# Patient Record
Sex: Male | Born: 1990 | Race: Black or African American | Hispanic: No | Marital: Single | Smoking: Current every day smoker
Health system: Southern US, Community
[De-identification: ages and names within clinical notes are randomized; demographics above are authoritative.]

## PROBLEM LIST (undated history)

## (undated) DIAGNOSIS — M778 Other enthesopathies, not elsewhere classified: Secondary | ICD-10-CM

## (undated) DIAGNOSIS — G43909 Migraine, unspecified, not intractable, without status migrainosus: Secondary | ICD-10-CM

---

## 2011-05-11 ENCOUNTER — Emergency Department (HOSPITAL_COMMUNITY)
Admission: EM | Admit: 2011-05-11 | Discharge: 2011-05-11 | Disposition: A | Discharge status: Home / Self Care | Payer: Self-pay | Attending: Emergency Medicine | Admitting: Emergency Medicine

## 2011-05-11 DIAGNOSIS — R0989 Other specified symptoms and signs involving the circulatory and respiratory systems: Secondary | ICD-10-CM | POA: Insufficient documentation

## 2011-05-11 DIAGNOSIS — R0609 Other forms of dyspnea: Secondary | ICD-10-CM | POA: Insufficient documentation

## 2011-05-11 DIAGNOSIS — J029 Acute pharyngitis, unspecified: Secondary | ICD-10-CM | POA: Insufficient documentation

## 2011-06-22 ENCOUNTER — Emergency Department (HOSPITAL_COMMUNITY)
Admission: EM | Admit: 2011-06-22 | Discharge: 2011-06-22 | Disposition: A | Discharge status: Home / Self Care | Payer: Self-pay | Attending: Emergency Medicine | Admitting: Emergency Medicine

## 2011-06-22 DIAGNOSIS — J069 Acute upper respiratory infection, unspecified: Secondary | ICD-10-CM | POA: Insufficient documentation

## 2011-06-22 DIAGNOSIS — R070 Pain in throat: Secondary | ICD-10-CM | POA: Insufficient documentation

## 2011-06-22 DIAGNOSIS — R05 Cough: Secondary | ICD-10-CM | POA: Insufficient documentation

## 2011-06-22 DIAGNOSIS — R509 Fever, unspecified: Secondary | ICD-10-CM | POA: Insufficient documentation

## 2011-06-22 DIAGNOSIS — R059 Cough, unspecified: Secondary | ICD-10-CM | POA: Insufficient documentation

## 2011-06-22 DIAGNOSIS — J3489 Other specified disorders of nose and nasal sinuses: Secondary | ICD-10-CM | POA: Insufficient documentation

## 2013-01-08 ENCOUNTER — Emergency Department (HOSPITAL_COMMUNITY)
Admission: EM | Admit: 2013-01-08 | Discharge: 2013-01-08 | Disposition: A | Discharge status: Home / Self Care | Payer: BC Managed Care – PPO | Attending: Emergency Medicine | Admitting: Emergency Medicine

## 2013-01-08 ENCOUNTER — Encounter (HOSPITAL_COMMUNITY): Payer: Self-pay | Admitting: Emergency Medicine

## 2013-01-08 DIAGNOSIS — B9789 Other viral agents as the cause of diseases classified elsewhere: Secondary | ICD-10-CM | POA: Insufficient documentation

## 2013-01-08 DIAGNOSIS — J020 Streptococcal pharyngitis: Secondary | ICD-10-CM

## 2013-01-08 DIAGNOSIS — B349 Viral infection, unspecified: Secondary | ICD-10-CM

## 2013-01-08 DIAGNOSIS — R509 Fever, unspecified: Secondary | ICD-10-CM | POA: Insufficient documentation

## 2013-01-08 DIAGNOSIS — Z87891 Personal history of nicotine dependence: Secondary | ICD-10-CM | POA: Insufficient documentation

## 2013-01-08 LAB — RAPID STREP SCREEN (MED CTR MEBANE ONLY): Streptococcus, Group A Screen (Direct): NEGATIVE

## 2013-01-08 MED ORDER — AMOXICILLIN 500 MG PO CAPS: 500.0000 mg | ORAL_CAPSULE | Freq: Two times a day (BID) | ORAL | Status: DC

## 2013-01-08 MED ORDER — ACETAMINOPHEN 500 MG PO TABS
1000.0000 mg | ORAL_TABLET | Freq: Once | ORAL | Status: AC
Start: 2013-01-08 — End: 2013-01-08
  Administered 2013-01-08: 1000 mg via ORAL
  Filled 2013-01-08: qty 2

## 2013-01-08 MED ORDER — KETOROLAC TROMETHAMINE 60 MG/2ML IM SOLN
60.0000 mg | Freq: Once | INTRAMUSCULAR | Status: AC
  Administered 2013-01-08: 60 mg via INTRAMUSCULAR
  Filled 2013-01-08: qty 2

## 2013-01-08 NOTE — ED Notes (Addendum)
Pt here for c/o generalized aches and  Pain, chills and Sore throat x 4 days

## 2013-01-08 NOTE — ED Provider Notes (Signed)
History     CSN: [id number]  Arrival date & time 01/08/13  0900   First MD Initiated Contact with Patient 01/08/13 [phone]      No chief complaint on file.   (Consider location/radiation/quality/duration/timing/severity/associated sxs/prior treatment) The history is provided by the patient.  Allen Mckinney is a 22 y.o. male otherwise a here presenting with generalized body aches for the last 5 days. Has sore throat and bodyaches for the last 5 days. Also subjective fever and chills at home. He has some nonproductive cough as well. He had a flu shot in December. No urinary symptoms or normal pain or chest pain.    History reviewed. No pertinent past medical history.  No past surgical history on file.  No family history on file.  History  Substance Use Topics  . Smoking status: Former Smoker -- 0.50 packs/day    Types: Cigarettes  . Smokeless tobacco: Not on file  . Alcohol Use: No      Review of Systems  Constitutional: Positive for fever and chills.  HENT: Positive for sore throat.   All other systems reviewed and are negative.    Allergies  Review of patient's allergies indicates no known allergies.  Home Medications   Current Outpatient Rx  Name  Route  Sig  Dispense  Refill  . Dextromethorphan-Guaifenesin (ALKA-SELTZER PLUS MUCUS & CONG) 10-200 MG CAPS   Oral   Take 1 tablet by mouth every 6 (six) hours as needed (cold symptoms).         Marland Kitchen ibuprofen (ADVIL,MOTRIN) 800 MG tablet   Oral   Take 800 mg by mouth every 8 (eight) hours as needed for pain (pain).         . pseudoephedrine-guaifenesin (MUCINEX D) 60-600 MG per tablet   Oral   Take 1 tablet by mouth every 12 (twelve) hours.           BP 116/72  Pulse 104  Temp(Src) 99.6 F (37.6 C) (Oral)  Resp 20  SpO2 100%  Physical Exam  Nursing note and vitals reviewed. Constitutional: He is oriented to person, place, and time.  Uncomfortable, shivering from chills.   HENT:  Head: Normocephalic.   OP red with white exudates. Tonsils not enlarged.   Eyes: Conjunctivae are normal. Pupils are equal, round, and reactive to light.  Neck: Normal range of motion.  + mild cervical tender LAD   Cardiovascular: Normal rate, regular rhythm and normal heart sounds.   Pulmonary/Chest: Effort normal and breath sounds normal. No respiratory distress. He has no wheezes. He has no rales.  Abdominal: Soft. Bowel sounds are normal. He exhibits no distension. There is no tenderness. There is no rebound and no guarding.  Musculoskeletal: Normal range of motion.  Neurological: He is alert and oriented to person, place, and time.  Skin: Skin is warm and dry.  Psychiatric: He has a normal mood and affect. His behavior is normal. Judgment and thought content normal.    ED Course  Procedures (including critical care time)  Labs Reviewed  RAPID STREP SCREEN   No results found.   No diagnosis found.    MDM  Allen Mckinney is a 22 y.o. male here with myalgias and sore throat. Centor criteria 3/4. Rapid strep sent. He probably has viral syndrome as well. I counseled him about tamiflu and said that he is outside window for treatment. He understands the risk and benefits of the medicine and decided not to take it.   10:17 AM Strep neg.  Felt better after meds. My clinical suspicion high for strep so I gave him a script for amoxicillin and told him to take it if he still doesn't feel better in 2-3 days.        Richardean Canal, MD 01/08/13 1018

## 2015-08-19 ENCOUNTER — Emergency Department (HOSPITAL_COMMUNITY)
Admission: EM | Admit: 2015-08-19 | Discharge: 2015-08-19 | Disposition: A | Discharge status: Home / Self Care | Payer: BLUE CROSS/BLUE SHIELD | Attending: Emergency Medicine | Admitting: Emergency Medicine

## 2015-08-19 ENCOUNTER — Encounter (HOSPITAL_COMMUNITY): Payer: Self-pay | Admitting: Emergency Medicine

## 2015-08-19 DIAGNOSIS — K644 Residual hemorrhoidal skin tags: Secondary | ICD-10-CM | POA: Insufficient documentation

## 2015-08-19 DIAGNOSIS — K602 Anal fissure, unspecified: Secondary | ICD-10-CM | POA: Insufficient documentation

## 2015-08-19 DIAGNOSIS — K649 Unspecified hemorrhoids: Secondary | ICD-10-CM

## 2015-08-19 DIAGNOSIS — K6289 Other specified diseases of anus and rectum: Secondary | ICD-10-CM | POA: Diagnosis present

## 2015-08-19 DIAGNOSIS — Z720 Tobacco use: Secondary | ICD-10-CM | POA: Diagnosis not present

## 2015-08-19 LAB — POC OCCULT BLOOD, ED: Fecal Occult Bld: NEGATIVE

## 2015-08-19 MED ORDER — DILTIAZEM GEL 2 %: 1.0000 "application " | Freq: Two times a day (BID) | CUTANEOUS | Status: DC | PRN

## 2015-08-19 MED ORDER — HYDROCORTISONE 2.5 % RE CREA: TOPICAL_CREAM | RECTAL | Status: DC

## 2015-08-19 MED ORDER — NITROGLYCERIN 0.4 % RE OINT: 1.0000 "application " | TOPICAL_OINTMENT | Freq: Two times a day (BID) | RECTAL | Status: DC | PRN

## 2015-08-19 NOTE — ED Provider Notes (Signed)
CSN: [id number]     Arrival date & time 08/19/15  1820 History   First MD Initiated Contact with Patient 08/19/15 1911     Chief Complaint  Patient presents with  . Rectal Pain     (Consider location/radiation/quality/duration/timing/severity/associated sxs/prior Treatment) Patient is a 24 y.o. male presenting with general illness. The history is provided by the patient.  Illness Location:  Anal region Quality:  Spasms Severity:  Moderate Onset quality:  Gradual Duration:  3 weeks Timing:  Intermittent Progression:  Unchanged Chronicity:  New Context:  Following hard stool, small episode of BRBPR Relieved by:  Nothing tried Worsened by:  Defecation Ineffective treatments:  Nothing tried Associated symptoms: no abdominal pain, no chest pain, no ear pain, no fatigue, no fever, no headaches, no loss of consciousness, no nausea, no rash and no shortness of breath   Risk factors:  Constipation hx   History reviewed. No pertinent past medical history. History reviewed. No pertinent past surgical history. No family history on file. Social History  Substance Use Topics  . Smoking status: Current Every Day Smoker -- 0.50 packs/day    Types: Cigarettes  . Smokeless tobacco: None  . Alcohol Use: No    Review of Systems  Constitutional: Negative for fever and fatigue.  HENT: Negative for ear pain and facial swelling.   Respiratory: Negative for shortness of breath.   Cardiovascular: Negative for chest pain.  Gastrointestinal: Positive for constipation and rectal pain. Negative for nausea and abdominal pain.  Genitourinary: Negative for dysuria.  Musculoskeletal: Negative for back pain.  Skin: Negative for rash.  Neurological: Negative for loss of consciousness and headaches.  Psychiatric/Behavioral: Negative for confusion.      Allergies  Review of patient's allergies indicates no known allergies.  Home Medications   Prior to Admission medications   Medication Sig  Start Date End Date Taking? Authorizing Provider  amoxicillin (AMOXIL) 500 MG capsule Take 1 capsule (500 mg total) by mouth 2 (two) times daily. 01/08/13   Richardean Canal, MD  Dextromethorphan-Guaifenesin (ALKA-SELTZER PLUS MUCUS & CONG) 10-200 MG CAPS Take 1 tablet by mouth every 6 (six) hours as needed (cold symptoms).    Historical Provider, MD  ibuprofen (ADVIL,MOTRIN) 800 MG tablet Take 800 mg by mouth every 8 (eight) hours as needed for pain (pain).    Historical Provider, MD  pseudoephedrine-guaifenesin (MUCINEX D) 60-600 MG per tablet Take 1 tablet by mouth every 12 (twelve) hours.    Historical Provider, MD   BP 122/71 mmHg  Pulse 77  Temp(Src) 98.9 F (37.2 C) (Oral)  Resp 16  SpO2 99% Physical Exam  Constitutional: He is oriented to person, place, and time. He appears well-developed and well-nourished. No distress.  HENT:  Head: Normocephalic and atraumatic.  Right Ear: External ear normal.  Left Ear: External ear normal.  Nose: Nose normal.  Mouth/Throat: Oropharynx is clear and moist. No oropharyngeal exudate.  Eyes: Conjunctivae and EOM are normal. Pupils are equal, round, and reactive to light. Right eye exhibits no discharge. Left eye exhibits no discharge. No scleral icterus.  Neck: Normal range of motion. Neck supple. No JVD present. No tracheal deviation present. No thyromegaly present.  Cardiovascular: Normal rate, regular rhythm and intact distal pulses.   Pulmonary/Chest: Effort normal. No stridor. No respiratory distress.  Abdominal: Soft. He exhibits no distension.  Genitourinary: Rectal exam shows external hemorrhoid, fissure and tenderness. Rectal exam shows anal tone normal. Prostate is not enlarged and not tender.  Musculoskeletal: Normal range of motion.  He exhibits no edema or tenderness.  Lymphadenopathy:    He has no cervical adenopathy.  Neurological: He is alert and oriented to person, place, and time.  Skin: Skin is warm and dry. No rash noted. He is not  diaphoretic. No erythema. No pallor.  Psychiatric: He has a normal mood and affect. His behavior is normal. Judgment and thought content normal.  Nursing note and vitals reviewed.   ED Course  Procedures (including critical care time) Labs Review Labs Reviewed  POC OCCULT BLOOD, ED     MDM   Final diagnoses:  None    Patient endorsing intermittent anal spasms following a bout of hard stools approximately 3 weeks ago. Patient had brief amount of bright red blood per rectum. But is not having any rectal bleeding time. Patient is not tried anything. Hard stools have resolved. On exam patient has findings consistent with healing anal fissure with small tag at the posterior anus. Also some external hemorrhoids.  No concerning findings. Patient patient advised on use of fiber and other stool softeners to prevent recurrence. We'll provide prescription for antispasmodic topical agents and steroid cream for his hemorrhoids.  Patient  given return precautions for anal pain.  Advised to return for worsening symptoms including chest pain, shortness of breath, severe headache, intractable nausea or vomiting, fever, or chills, inability to take medications, or other acute concerns.  Advised to follow up with PCP in 1 week.  Patient  in agreement with and expressed understanding of follow plan, plan of care, and return precautions.  All questions answered prior to discharge.  Patient was discharged in stable condition, ambulating without difficulty.   Patient care was discussed with my attending, Dr. Radford PaxBeaton.   Gavin PoundJustin Sephiroth Mcluckie, MD 08/20/15 1542  Nelva Nayobert Beaton, MD 08/25/15 [phone]

## 2015-08-19 NOTE — Discharge Instructions (Signed)
Anal Fissure, Adult An anal fissure is a small tear or crack in the skin around the anus. Bleeding from a fissure usually stops on its own within a few minutes. However, bleeding will often occur again with each bowel movement until the crack heals. CAUSES This condition may be caused by:  Passing large, hard stool (feces).  Frequent diarrhea.  Constipation.  Inflammatory bowel disease (Crohn disease or ulcerative colitis).  Infections.  Anal sex. SYMPTOMS Symptoms of this condition include:  Bleeding from the rectum.  Small amounts of blood seen on your stool, on toilet paper, or in the toilet after a bowel movement.  Painful bowel movements.  Itching or irritation around the anus. DIAGNOSIS A health care provider may diagnose this condition by closely examining the anal area. An anal fissure can usually be seen with careful inspection. In some cases, a rectal exam may be performed, or a short tube (anoscope) may be used to examine the anal canal. TREATMENT Treatment for this condition may include:  Taking steps to avoid constipation. This may include making changes to your diet, such as increasing your intake of fiber or fluid.  Taking fiber supplements. These supplements can soften your stool to help make bowel movements easier. Your health care provider may also prescribe a stool softener if your stool is often hard.  Taking sitz baths. This may help to heal the tear.  Using medicated creams or ointments. These may be prescribed to lessen discomfort. HOME CARE INSTRUCTIONS Eating and Drinking  Avoid foods that may be constipating, such as bananas and dairy products.  Drink enough fluid to keep your urine clear or pale yellow.  Maintain a diet that is high in fruits, whole grains, and vegetables. General Instructions  Keep the anal area as clean and dry as possible.  Take sitz baths as told by your health care provider. Do not use soap in the sitz baths.  Take  over-the-counter and prescription medicines only as told by your health care provider.  Use creams or ointments only as told by your health care provider.  Keep all follow-up visits as told by your health care provider. This is important. SEEK MEDICAL CARE IF:  You have more bleeding.  You have a fever.  You have diarrhea that is mixed with blood.  You continue to have pain.  Your problem is getting worse rather than better.   This information is not intended to replace advice given to you by your health care provider. Make sure you discuss any questions you have with your health care provider.   Document Released: 10/14/2005 Document Revised: 07/05/2015 Document Reviewed: 01/09/2015 Elsevier Interactive Patient Education 2016 [employer].  Hemorrhoids Hemorrhoids are swollen veins around the rectum or anus. There are two types of hemorrhoids:   Internal hemorrhoids. These occur in the veins just inside the rectum. They may poke through to the outside and become irritated and painful.  External hemorrhoids. These occur in the veins outside the anus and can be felt as a painful swelling or hard lump near the anus. CAUSES  Pregnancy.   Obesity.   Constipation or diarrhea.   Straining to have a bowel movement.   Sitting for long periods on the toilet.  Heavy lifting or other activity that caused you to strain.  Anal intercourse. SYMPTOMS   Pain.   Anal itching or irritation.   Rectal bleeding.   Fecal leakage.   Anal swelling.   One or more lumps around the anus.  DIAGNOSIS  Your caregiver may be able to diagnose hemorrhoids by visual examination. Other examinations or tests that may be performed include:   Examination of the rectal area with a gloved hand (digital rectal exam).   Examination of anal canal using a small tube (scope).   A blood test if you have lost a significant amount of blood.  A test to look inside the colon  (sigmoidoscopy or colonoscopy). TREATMENT Most hemorrhoids can be treated at home. However, if symptoms do not seem to be getting better or if you have a lot of rectal bleeding, your caregiver may perform a procedure to help make the hemorrhoids get smaller or remove them completely. Possible treatments include:   Placing a rubber band at the base of the hemorrhoid to cut off the circulation (rubber band ligation).   Injecting a chemical to shrink the hemorrhoid (sclerotherapy).   Using a tool to burn the hemorrhoid (infrared light therapy).   Surgically removing the hemorrhoid (hemorrhoidectomy).   Stapling the hemorrhoid to block blood flow to the tissue (hemorrhoid stapling).  HOME CARE INSTRUCTIONS   Eat foods with fiber, such as whole grains, beans, nuts, fruits, and vegetables. Ask your doctor about taking products with added fiber in them (fibersupplements).  Increase fluid intake. Drink enough water and fluids to keep your urine clear or pale yellow.   Exercise regularly.   Go to the bathroom when you have the urge to have a bowel movement. Do not wait.   Avoid straining to have bowel movements.   Keep the anal area dry and clean. Use wet toilet paper or moist towelettes after a bowel movement.   Medicated creams and suppositories may be used or applied as directed.   Only take over-the-counter or prescription medicines as directed by your caregiver.   Take warm sitz baths for 15-20 minutes, 3-4 times a day to ease pain and discomfort.   Place ice packs on the hemorrhoids if they are tender and swollen. Using ice packs between sitz baths may be helpful.   Put ice in a plastic bag.   Place a towel between your skin and the bag.   Leave the ice on for 15-20 minutes, 3-4 times a day.   Do not use a donut-shaped pillow or sit on the toilet for long periods. This increases blood pooling and pain.  SEEK MEDICAL CARE IF:  You have increasing pain and  swelling that is not controlled by treatment or medicine.  You have uncontrolled bleeding.  You have difficulty or you are unable to have a bowel movement.  You have pain or inflammation outside the area of the hemorrhoids. MAKE SURE YOU:  Understand these instructions.  Will watch your condition.  Will get help right away if you are not doing well or get worse.   This information is not intended to replace advice given to you by your health care provider. Make sure you discuss any questions you have with your health care provider.   Document Released: 10/11/2000 Document Revised: 09/30/2012 Document Reviewed: 08/18/2012 Elsevier Interactive Patient Education 2016 Elsevier Inc.  High-Fiber Diet Fiber, also called dietary fiber, is a type of carbohydrate found in fruits, vegetables, whole grains, and beans. A high-fiber diet can have many health benefits. Your health care provider may recommend a high-fiber diet to help:  Prevent constipation. Fiber can make your bowel movements more regular.  Lower your cholesterol.  Relieve hemorrhoids, uncomplicated diverticulosis, or irritable bowel syndrome.  Prevent overeating as part of a weight-loss  plan.  Prevent heart disease, type 2 diabetes, and certain cancers. WHAT IS MY PLAN? The recommended daily intake of fiber includes:  38 grams for men under age 24.  30 grams for men over age 24.  25 grams for women under age 24.  21 grams for women over age 24. You can get the recommended daily intake of dietary fiber by eating a variety of fruits, vegetables, grains, and beans. Your health care provider may also recommend a fiber supplement if it is not possible to get enough fiber through your diet. WHAT DO I NEED TO KNOW ABOUT A HIGH-FIBER DIET?  Fiber supplements have not been widely studied for their effectiveness, so it is better to get fiber through food sources.  Always check the fiber content on thenutrition facts label of  any prepackaged food. Look for foods that contain at least 5 grams of fiber per serving.  Ask your dietitian if you have questions about specific foods that are related to your condition, especially if those foods are not listed in the following section.  Increase your daily fiber consumption gradually. Increasing your intake of dietary fiber too quickly may cause bloating, cramping, or gas.  Drink plenty of water. Water helps you to digest fiber. WHAT FOODS CAN I EAT? Grains Whole-grain breads. Multigrain cereal. Oats and oatmeal. Brown rice. Barley. Bulgur wheat. Millet. Bran muffins. Popcorn. Rye wafer crackers. Vegetables Sweet potatoes. Spinach. Kale. Artichokes. Cabbage. Broccoli. Green peas. Carrots. Squash. Fruits Berries. Pears. Apples. Oranges. Avocados. Prunes and raisins. Dried figs. Meats and Other Protein Sources Navy, kidney, pinto, and soy beans. Split peas. Lentils. Nuts and seeds. Dairy Fiber-fortified yogurt. Beverages Fiber-fortified soy milk. Fiber-fortified orange juice. Other Fiber bars. The items listed above may not be a complete list of recommended foods or beverages. Contact your dietitian for more options. WHAT FOODS ARE NOT RECOMMENDED? Grains White bread. Pasta made with refined flour. White rice. Vegetables Fried potatoes. Canned vegetables. Well-cooked vegetables.  Fruits Fruit juice. Cooked, strained fruit. Meats and Other Protein Sources Fatty cuts of meat. Fried Environmental education officerpoultry or fried fish. Dairy Milk. Yogurt. Cream cheese. Sour cream. Beverages Soft drinks. Other Cakes and pastries. Butter and oils. The items listed above may not be a complete list of foods and beverages to avoid. Contact your dietitian for more information. WHAT ARE SOME TIPS FOR INCLUDING HIGH-FIBER FOODS IN MY DIET?  Eat a wide variety of high-fiber foods.  Make sure that half of all grains consumed each day are whole grains.  Replace breads and cereals made from refined  flour or white flour with whole-grain breads and cereals.  Replace white rice with brown rice, bulgur wheat, or millet.  Start the day with a breakfast that is high in fiber, such as a cereal that contains at least 5 grams of fiber per serving.  Use beans in place of meat in soups, salads, or pasta.  Eat high-fiber snacks, such as berries, raw vegetables, nuts, or popcorn.   This information is not intended to replace advice given to you by your health care provider. Make sure you discuss any questions you have with your health care provider.   Document Released: 10/14/2005 Document Revised: 11/04/2014 Document Reviewed: 03/29/2014 Elsevier Interactive Patient Education 2016 [employer]  Emergency Department Resource Guide 1) Find a Doctor and Pay Out of Pocket Although you won't have to find out who is covered by your insurance plan, it is a good idea to ask around and get recommendations. You will then need  to call the office and see if the doctor you have chosen will accept you as a new patient and what types of options they offer for patients who are self-pay. Some doctors offer discounts or will set up payment plans for their patients who do not have insurance, but you will need to ask so you aren't surprised when you get to your appointment.  2) Contact Your Local Health Department Not all health departments have doctors that can see patients for sick visits, but many do, so it is worth a call to see if yours does. If you don't know where your local health department is, you can check in your phone book. The CDC also has a tool to help you locate your state's health department, and many state websites also have listings of all of their local health departments.  3) Find a Walk-in Clinic If your illness is not likely to be very severe or complicated, you may want to try a walk in clinic. These are popping up all over the country in pharmacies, drugstores, and shopping centers. They're  usually staffed by nurse practitioners or physician assistants that have been trained to treat common illnesses and complaints. They're usually fairly quick and inexpensive. However, if you have serious medical issues or chronic medical problems, these are probably not your best option.  No Primary Care Doctor: - Call Health Connect at  [phone] - they can help you locate a primary care doctor that  accepts your insurance, provides certain services, etc. - Physician Referral Service- [phone]  Chronic Pain Problems: Organization         Address  Phone   Notes  [facility]  [phone] Patients need to be referred by their primary care doctor.   Medication Assistance: Organization         Address  Phone   Notes  [facility] Medication [facility] [address] [place], [place] [address] [phone] --Must be a resident of [facility] -- Must have NO insurance coverage whatsoever (no Medicaid/ Medicare, etc.) -- The pt. MUST have a primary care doctor that directs their care regularly and follows them in the community   [facility]ist  [phone]   [employer]  [phone]    Agencies that provide inexpensive medical care: Organization         Address  Phone   Notes  [facility]  [phone]   [facility]    [phone]   [facility] [address] [place], [place] [address] [phone]   [facility] [address] [place] [place] [phone]   Planned Parenthood    [phone]   [facility]    [phone]   Community [facility] and [facility]  [address], [place] Phone:  [phone], Fax:  [phone] Hours of Operation:  9 am - 6 pm, M-F.  Also accepts Medicaid/Medicare and self-pay.  [facility]  [address], [place] Phone: [phone], Fax: [phone]. Hours  of Operation:  8:30 am - 5:30 pm, M-F.  Also accepts Medicaid and self-pay.  [facility] [place] [address] [place] Phone: [phone]   [facility] [address] Natasha Bence [place], [place] [phone], Ext. [phone] Mondays & Thursdays: 7-9 AM.  First 15 patients are seen on a first come, first serve basis.  Medicaid-accepting [facility] Providers:  Organization         Address  Phone   Notes  [facility] [address] [place], [place] [phone] Also accepts self-pay patients.  [facility] [address] Laurell Josephs [place], [place]  [phone]   [facility] [address], [place] [phone]   [facility] [address], [place] [phone]   Renaye Rakers [address], [place]   [phone] Only accepts [place] patients after they have their name applied to their card.   Self-Pay (no insurance) in [facility]:  Organization         Address  Phone   Notes  Sickle Cell Patients, [facility] [address] [place], [place] [phone]   [facility] [address] [place], [place] [phone]   [facility] [place]  [address], [place] [phone]   [facility]/Dr. Osei-Bonsu  [address] [place] or [address], [place] [phone] Phone number for both [place] and [place] locations is the same.  Urgent Medical and [facility] [address] [place] [phone]   [facility] [address], [place] or [address] [phone]   [facility] [address], [place] [phone], phone; [phone], fax Sees patients 1st and 3rd Saturday of every month.  Must not qualify for public or private insurance (i.e. Medicaid,  Medicare, [place] Health Choice, Veterans' Benefits)  Household income should be no more than 200% of the poverty level The clinic cannot treat you if you are pregnant or think you are pregnant  Sexually transmitted diseases are not treated at the clinic.    Dental Care: Organization         Address  Phone  Notes  [facility] Department of [facility] [address] [place], [place] [phone] Accepts children up to age 61 who are enrolled in [place] or [place] Health Choice; pregnant women with a Medicaid card; and children who have applied for Medicaid or [place] Health Choice, but were declined, whose parents can pay a reduced fee at time of service.  [facility] Department of [facility]  [address], [place] [phone] Accepts children up to age 27 who are enrolled in [place] or [place] Health Choice; pregnant women with a Medicaid card; and children who have applied for Medicaid or [place] Health Choice, but were declined, whose parents can pay a reduced fee at time of service.  [facility]  [address] [place], [place] [phone] Patients are seen by appointment only. Walk-ins are not accepted. [facility] will see patients 83 years of age and older. Monday - Tuesday (8am-5pm) Most Wednesdays (8:30-5pm) $30 per visit, cash only  [facility] Adult Dental Access PRO[facility]  [address], [facility] [phone] Patients are seen by appointment only. Walk-ins are not accepted. [facility] will see patients 17 years of age and older. One Wednesday Evening (Monthly: Volunteer Based).  $30 per visit, cash only  [employer]  [phone] for adults; Children under age 23, call Graduate Pediatric Dentistry at [phone]. Children aged 46-14, please call [phone] to request a pediatric  application.  Dental services are provided in all areas of dental care including fillings, crowns  and bridges, complete and partial dentures, implants, gum treatment, root canals, and extractions. Preventive care is also provided. Treatment is provided to both adults and children. Patients are selected via a lottery and there is often a waiting list.   [facility] [address] [place]  [phone] [email or web address]   [facility] [address] [place], [place] [phone], Ext. [phone] Second and Fourth Thursday of each month, opens at 6:30 AM; Clinic ends at 9 AM.  Patients are seen on a first-come first-served basis, and a limited number are seen during each clinic.   [facility]  [address] Ether Griffins [place], [place] [phone]   Eligibility Requirements You must have lived in [place], [place], or [place] counties for at least the last three months.   You cannot be eligible for state or federal sponsored [place], including [employer], [place], or [employer].   You generally cannot be eligible for healthcare insurance through your employer.    How to apply: Eligibility screenings are held every Tuesday and Wednesday afternoon from 1:00 pm until 4:00 pm. You do not need an appointment for the interview!  [facility] [address] [place], [place] [phone]   [facility] Health Department  [phone]   [facility] Health Department  [phone]   [facility] Health Department  [phone]    Behavioral Health Resources in the Community: Intensive Outpatient Programs Organization         Address  Phone  Notes  [facility] Services [address] [place], [place] [phone]   [facility] Outpatient [address] [place], [place] [phone]   [facility] [address] [place], [place]  [phone]   [facility] Mental Health [address],  [place], [place] [phone] or [phone]   Substance Abuse  Resources Organization         Address  Phone  Notes  [facility]  [phone]   [facility]  [phone]   [facility] [place]  [phone]   Floydene Flock  [phone]   Residential & Outpatient Substance Abuse Program  [phone]   Psychological Services Organization         Address  Phone  Notes  [facility] Health  [phone]   [facility] Services  [phone]   [facility] Mental Health [address] [place] [phone] or [phone]    Mobile Crisis Teams Organization         Address  Phone  Notes  [facility], Mobile Crisis Care Unit  [phone]   [facility]  [address] [place], [place] [phone]   Doristine Locks [address] [place] [phone]    Self-Help/Support Groups Organization         Address  Phone             Notes  [facility] [place] - variety of support groups  336- I7437963 Call for more information  Narcotics Anonymous (NA), [facility] [address], [employer] [place]  2 meetings at this location   Statistician         Address  Phone  Notes  [facility] [address],    [place]  [phone]   [facility]  [address], [place] [address],  [place], [place] [phone]   [facility] [address] [place], [place] [phone] Admissions: 8am-3pm M-F  [facility] [address],    [place], [place] [phone]   [facility] [address] [place], [place], [place] [phone]   [facility] [address],  [place], [place] [phone]   [facility] - Intensive Outpatient [address] Laurell Josephs [address], [place], [place] [phone]   [facility] ([facility] [address] [place]  [place], [place] [phone] or [phone]   [facility] ([facility]) [address], [place], [place] [phone] Accepts Medicaid  [facility] [address]  [employer] [place] [phone] Substance Abuse/Addiction Treatment   [facility] Organization         Address  Phone  Notes  [facility]  [phone]   Angie Fava, PhD [address] Ervin Knack [place], [place]   [phone] or [phone]   [facility] Behavioral   [address] [place], [place] [phone]   [facility] [address] [place], [place] [phone] Insurance/Medicaid/sponsorship through [facility] and Families [address]                                    [place], [place] [phone] Therapy/tele-psych/case  [facility] [address], [place] [phone]    Dr. Lolly Mustache  [phone]   [facility] [place] [facility]  [employer] [facility] Dept. 1) [address] [place] 2) [address], [place] 3)  [address] [place] [address], [place] [phone]  [phone]   [facility] Child Abuse Hotline (249)581-4905 or [phone] (After Hours)

## 2015-08-19 NOTE — ED Notes (Signed)
Pt reports rectal pain x 3 weeks. Pt states pain occurs upon bowel movements and change in position. Pt reports blood in stool three weeks ago when pain started but denies blood in stool at this time. Pt denies N/V/D, LOC, and syncope. Pt alert and oriented at this time in triage, ambulatory, and NAD.

## 2016-02-01 ENCOUNTER — Emergency Department (HOSPITAL_COMMUNITY)
Admission: EM | Admit: 2016-02-01 | Discharge: 2016-02-01 | Disposition: A | Discharge status: Home / Self Care | Payer: BLUE CROSS/BLUE SHIELD | Attending: Emergency Medicine | Admitting: Emergency Medicine

## 2016-02-01 ENCOUNTER — Encounter (HOSPITAL_COMMUNITY): Payer: Self-pay | Admitting: Emergency Medicine

## 2016-02-01 DIAGNOSIS — Z791 Long term (current) use of non-steroidal anti-inflammatories (NSAID): Secondary | ICD-10-CM | POA: Diagnosis not present

## 2016-02-01 DIAGNOSIS — G43809 Other migraine, not intractable, without status migrainosus: Secondary | ICD-10-CM

## 2016-02-01 DIAGNOSIS — R51 Headache: Secondary | ICD-10-CM | POA: Diagnosis present

## 2016-02-01 DIAGNOSIS — F17210 Nicotine dependence, cigarettes, uncomplicated: Secondary | ICD-10-CM | POA: Diagnosis not present

## 2016-02-01 MED ORDER — SODIUM CHLORIDE 0.9 % IV SOLN
1000.0000 mL | INTRAVENOUS | Status: DC
  Administered 2016-02-01: 1000 mL via INTRAVENOUS

## 2016-02-01 MED ORDER — MORPHINE SULFATE (PF) 4 MG/ML IV SOLN
4.0000 mg | Freq: Once | INTRAVENOUS | Status: AC
  Administered 2016-02-01: 4 mg via INTRAVENOUS
  Filled 2016-02-01: qty 1

## 2016-02-01 MED ORDER — ONDANSETRON HCL 4 MG/2ML IJ SOLN
4.0000 mg | Freq: Once | INTRAMUSCULAR | Status: AC
  Administered 2016-02-01: 4 mg via INTRAVENOUS
  Filled 2016-02-01: qty 2

## 2016-02-01 MED ORDER — HYDROCODONE-ACETAMINOPHEN 5-325 MG PO TABS: 1.0000 | ORAL_TABLET | Freq: Four times a day (QID) | ORAL | Status: DC | PRN

## 2016-02-01 MED ORDER — IBUPROFEN 600 MG PO TABS: 600.0000 mg | ORAL_TABLET | Freq: Three times a day (TID) | ORAL | Status: DC | PRN

## 2016-02-01 MED ORDER — HYDROCODONE-ACETAMINOPHEN 5-325 MG PO TABS
1.0000 | ORAL_TABLET | Freq: Once | ORAL | Status: AC
  Administered 2016-02-01: 1 via ORAL
  Filled 2016-02-01: qty 1

## 2016-02-01 MED ORDER — KETOROLAC TROMETHAMINE 30 MG/ML IJ SOLN
30.0000 mg | Freq: Once | INTRAMUSCULAR | Status: AC
  Administered 2016-02-01: 30 mg via INTRAVENOUS
  Filled 2016-02-01: qty 1

## 2016-02-01 MED ORDER — SODIUM CHLORIDE 0.9 % IV SOLN
1000.0000 mL | Freq: Once | INTRAVENOUS | Status: AC
  Administered 2016-02-01: 1000 mL via INTRAVENOUS

## 2016-02-01 MED ORDER — METOCLOPRAMIDE HCL 5 MG/ML IJ SOLN
10.0000 mg | Freq: Once | INTRAMUSCULAR | Status: AC
  Administered 2016-02-01: 10 mg via INTRAVENOUS
  Filled 2016-02-01: qty 2

## 2016-02-01 NOTE — ED Notes (Signed)
Visitor given coffee

## 2016-02-01 NOTE — ED Notes (Signed)
Pt reports that his ear "popped" and is now having a severe headache.  EDP made aware of Pt's increase in pain.

## 2016-02-01 NOTE — ED Notes (Signed)
Per pt, states migraine-was seen by PCP yesterday-states nausea and eyes burning

## 2016-02-01 NOTE — Discharge Instructions (Signed)

## 2016-02-01 NOTE — ED Notes (Signed)
Patient stated his head pain has gotten worse

## 2016-02-01 NOTE — ED Provider Notes (Signed)
CSN: [id number]     Arrival date & time 02/01/16  1012 History   First MD Initiated Contact with Patient 02/01/16 1131     Chief Complaint  Patient presents with  . Migraine      HPI Patient presents to the emergency department with complaints of severe headache and nausea.  He states he has a migraine.  He's tried medications at home without improvement in his symptoms.  He has no significant history of headaches.  He denies weakness of his arms or legs.  Denies fevers and chills.  No neck pain.  No recent injury or trauma.  No heavy lifting.  Denies sore throat   History reviewed. No pertinent past medical history. History reviewed. No pertinent past surgical history. No family history on file. Social History  Substance Use Topics  . Smoking status: Current Every Day Smoker -- 0.50 packs/day    Types: Cigarettes  . Smokeless tobacco: None  . Alcohol Use: No    Review of Systems  All other systems reviewed and are negative.     Allergies  Kiwi extract  Home Medications   Prior to Admission medications   Medication Sig Start Date End Date Taking? Authorizing Provider  ibuprofen (ADVIL,MOTRIN) 600 MG tablet Take 600 mg by mouth every 8 (eight) hours as needed for moderate pain.   Yes Historical Provider, MD  naproxen (NAPROSYN) 500 MG tablet Take 500 mg by mouth 2 (two) times daily with a meal.   Yes Historical Provider, MD  amoxicillin (AMOXIL) 500 MG capsule Take 1 capsule (500 mg total) by mouth 2 (two) times daily. Patient not taking: Reported on 02/01/2016 01/08/13   Richardean Canalavid H Yao, MD  diltiazem 2 % GEL Apply 1 application topically 2 (two) times daily as needed (anal spasms). Patient not taking: Reported on 02/01/2016 08/19/15   Gavin PoundJustin Brooten, MD  hydrocortisone (ANUSOL-HC) 2.5 % rectal cream Apply rectally 2 times daily as needed for anal pain. Patient not taking: Reported on 02/01/2016 08/19/15   Gavin PoundJustin Brooten, MD  Nitroglycerin 0.4 % OINT Place 1 application rectally 2  (two) times daily as needed (anal spasms). 08/19/15   Gavin PoundJustin Brooten, MD   BP 151/103 mmHg  Pulse 97  Temp(Src) 97.9 F (36.6 C) (Oral)  Resp 18  SpO2 100% Physical Exam  Constitutional: He is oriented to person, place, and time. He appears well-developed and well-nourished.  HENT:  Head: Normocephalic and atraumatic.  Eyes: EOM are normal. Pupils are equal, round, and reactive to light.  Neck: Normal range of motion.  Cardiovascular: Normal rate, regular rhythm, normal heart sounds and intact distal pulses.   Pulmonary/Chest: Effort normal and breath sounds normal. No respiratory distress.  Abdominal: Soft. He exhibits no distension. There is no tenderness.  Musculoskeletal: Normal range of motion.  Neurological: He is alert and oriented to person, place, and time.  5/5 strength in major muscle groups of  bilateral upper and lower extremities. Speech normal. No facial asymetry.   Skin: Skin is warm and dry.  Psychiatric: He has a normal mood and affect. Judgment normal.  Nursing note and vitals reviewed.   ED Course  Procedures (including critical care time) Labs Review Labs Reviewed - No data to display  Imaging Review No results found. I have personally reviewed and evaluated these images and lab results as part of my medical decision-making.   EKG Interpretation None      MDM   Final diagnoses:  None    2:48 PM Patient feels  much better this time.  Discharge home in good condition.  Outpatient primary care follow-up.  No indication for acute imaging today.  Normal neurologic exam.    Azalia Bilis, MD 02/01/16 [phone]

## 2016-02-01 NOTE — ED Notes (Signed)
Gave patient Ginger Ale with ice.

## 2016-02-02 ENCOUNTER — Emergency Department (HOSPITAL_COMMUNITY)
Admission: EM | Admit: 2016-02-02 | Discharge: 2016-02-02 | Disposition: A | Discharge status: Home / Self Care | Payer: BLUE CROSS/BLUE SHIELD | Attending: Emergency Medicine | Admitting: Emergency Medicine

## 2016-02-02 ENCOUNTER — Emergency Department (HOSPITAL_COMMUNITY): Payer: BLUE CROSS/BLUE SHIELD

## 2016-02-02 ENCOUNTER — Encounter (HOSPITAL_COMMUNITY): Payer: Self-pay | Admitting: Emergency Medicine

## 2016-02-02 DIAGNOSIS — F17210 Nicotine dependence, cigarettes, uncomplicated: Secondary | ICD-10-CM | POA: Insufficient documentation

## 2016-02-02 DIAGNOSIS — R51 Headache: Secondary | ICD-10-CM | POA: Diagnosis not present

## 2016-02-02 DIAGNOSIS — R112 Nausea with vomiting, unspecified: Secondary | ICD-10-CM | POA: Insufficient documentation

## 2016-02-02 DIAGNOSIS — Z791 Long term (current) use of non-steroidal anti-inflammatories (NSAID): Secondary | ICD-10-CM | POA: Diagnosis not present

## 2016-02-02 DIAGNOSIS — R42 Dizziness and giddiness: Secondary | ICD-10-CM | POA: Insufficient documentation

## 2016-02-02 DIAGNOSIS — R519 Headache, unspecified: Secondary | ICD-10-CM

## 2016-02-02 DIAGNOSIS — H9201 Otalgia, right ear: Secondary | ICD-10-CM | POA: Diagnosis not present

## 2016-02-02 LAB — CBC WITH DIFFERENTIAL/PLATELET
BASOS ABS: 0 10*3/uL (ref 0.0–0.1)
Basophils Relative: 0 %
EOS PCT: 0 %
Eosinophils Absolute: 0 10*3/uL (ref 0.0–0.7)
HEMATOCRIT: 44.5 % (ref 39.0–52.0)
Hemoglobin: 15.1 g/dL (ref 13.0–17.0)
LYMPHS PCT: 15 %
Lymphs Abs: 1 10*3/uL (ref 0.7–4.0)
MCH: 27.9 pg (ref 26.0–34.0)
MCHC: 33.9 g/dL (ref 30.0–36.0)
MCV: 82.1 fL (ref 78.0–100.0)
MONO ABS: 0.3 10*3/uL (ref 0.1–1.0)
MONOS PCT: 5 %
Neutro Abs: 5.1 10*3/uL (ref 1.7–7.7)
Neutrophils Relative %: 80 %
PLATELETS: 220 10*3/uL (ref 150–400)
RBC: 5.42 MIL/uL (ref 4.22–5.81)
RDW: 12.6 % (ref 11.5–15.5)
WBC: 6.4 10*3/uL (ref 4.0–10.5)

## 2016-02-02 LAB — BASIC METABOLIC PANEL
ANION GAP: 11 (ref 5–15)
BUN: 16 mg/dL (ref 6–20)
CALCIUM: 9.3 mg/dL (ref 8.9–10.3)
CO2: 23 mmol/L (ref 22–32)
CREATININE: 0.83 mg/dL (ref 0.61–1.24)
Chloride: 107 mmol/L (ref 101–111)
GFR calc Af Amer: >60 mL/min (ref >60)
GLUCOSE: 112 mg/dL — AB (ref 65–99)
Potassium: 3.9 mmol/L (ref 3.5–5.1)
Sodium: 141 mmol/L (ref 135–145)

## 2016-02-02 MED ORDER — KETOROLAC TROMETHAMINE 30 MG/ML IJ SOLN
30.0000 mg | Freq: Once | INTRAMUSCULAR | Status: AC
  Administered 2016-02-02: 30 mg via INTRAVENOUS
  Filled 2016-02-02: qty 1

## 2016-02-02 MED ORDER — METOCLOPRAMIDE HCL 5 MG/ML IJ SOLN
10.0000 mg | Freq: Once | INTRAMUSCULAR | Status: AC
  Administered 2016-02-02: 10 mg via INTRAVENOUS
  Filled 2016-02-02: qty 2

## 2016-02-02 MED ORDER — DIPHENHYDRAMINE HCL 50 MG/ML IJ SOLN
12.5000 mg | Freq: Once | INTRAMUSCULAR | Status: AC
Start: 2016-02-02 — End: 2016-02-02
  Administered 2016-02-02: 12.5 mg via INTRAVENOUS
  Filled 2016-02-02: qty 1

## 2016-02-02 MED ORDER — SODIUM CHLORIDE 0.9 % IV BOLUS (SEPSIS)
1000.0000 mL | Freq: Once | INTRAVENOUS | Status: AC
  Administered 2016-02-02: 1000 mL via INTRAVENOUS

## 2016-02-02 NOTE — ED Provider Notes (Signed)
CSN: [id number]     Arrival date & time 02/02/16  1916 History   First MD Initiated Contact with Patient 02/02/16 2007     Chief Complaint  Patient presents with  . multiple complaints     HPI   Allen Mckinney is a 25 y.o. male with no pertinent PMH who presents to the ED with headache, which he states started yesterday and has progressively worsened since that time. He denies exacerbating factors. He states he was evaluated in the ED yesterday for the same symptoms, and notes he initially felt better, however his symptoms recurred. He states he has been taking ibuprofen with no significant symptom relief. He also reports right ear pain, dizziness, nausea, and vomiting. He denies fever, chills, abdominal pain, diarrhea, constipation, urinary symptoms. He denies history of headaches.   History reviewed. No pertinent past medical history. History reviewed. No pertinent past surgical history. History reviewed. No pertinent family history. Social History  Substance Use Topics  . Smoking status: Current Every Day Smoker -- 0.25 packs/day    Types: Cigarettes  . Smokeless tobacco: None  . Alcohol Use: No     Comment: occ      Review of Systems  Constitutional: Negative for fever and chills.  Eyes: Negative for visual disturbance.  Gastrointestinal: Positive for nausea and vomiting. Negative for abdominal pain, diarrhea and constipation.  Genitourinary: Negative for dysuria, urgency and frequency.  Neurological: Positive for dizziness and headaches. Negative for syncope, weakness and numbness.  All other systems reviewed and are negative.     Allergies  Kiwi extract  Home Medications   Prior to Admission medications   Medication Sig Start Date End Date Taking? Authorizing Provider  ibuprofen (ADVIL,MOTRIN) 600 MG tablet Take 1 tablet (600 mg total) by mouth every 8 (eight) hours as needed. 02/01/16  Yes Azalia BilisKevin Campos, MD  naproxen (NAPROSYN) 500 MG tablet Take 500 mg by mouth 2 (two)  times daily with a meal.   Yes Historical Provider, MD  HYDROcodone-acetaminophen (NORCO/VICODIN) 5-325 MG tablet Take 1 tablet by mouth every 6 (six) hours as needed for moderate pain. 02/01/16   Azalia BilisKevin Campos, MD    BP 134/71 mmHg  Pulse 65  Temp(Src) 98.4 F (36.9 C) (Oral)  Resp 20  SpO2 99% Physical Exam  Constitutional: He is oriented to person, place, and time. He appears well-developed and well-nourished. No distress.  HENT:  Head: Normocephalic and atraumatic.  Right Ear: Hearing, tympanic membrane, external ear and ear canal normal.  Left Ear: Hearing, tympanic membrane, external ear and ear canal normal.  Nose: Nose normal.  Mouth/Throat: Uvula is midline, oropharynx is clear and moist and mucous membranes are normal. No oropharyngeal exudate, posterior oropharyngeal edema, posterior oropharyngeal erythema or tonsillar abscesses.  Eyes: Conjunctivae, EOM and lids are normal. Pupils are equal, round, and reactive to light. Right eye exhibits no discharge. Left eye exhibits no discharge. No scleral icterus.  Neck: Normal range of motion. Neck supple.  Cardiovascular: Normal rate, regular rhythm, normal heart sounds, intact distal pulses and normal pulses.   Pulmonary/Chest: Effort normal and breath sounds normal. No respiratory distress. He has no wheezes. He has no rales.  Abdominal: Soft. Normal appearance and bowel sounds are normal. He exhibits no distension and no mass. There is no tenderness. There is no rigidity, no rebound and no guarding.  Musculoskeletal: Normal range of motion. He exhibits no edema or tenderness.  Neurological: He is alert and oriented to person, place, and time. He has normal  strength. No cranial nerve deficit or sensory deficit.  Skin: Skin is warm, dry and intact. No rash noted. He is not diaphoretic. No erythema. No pallor.  Psychiatric: He has a normal mood and affect. His speech is normal and behavior is normal.  Nursing note and vitals  reviewed.   ED Course  Procedures (including critical care time)  Labs Review Labs Reviewed  BASIC METABOLIC PANEL - Abnormal; Notable for the following:    Glucose, Bld 112 (*)    All other components within normal limits  CBC WITH DIFFERENTIAL/PLATELET    Imaging Review Ct Head Wo Contrast  02/02/2016  CLINICAL DATA:  Frontal headache for 7 days EXAM: CT HEAD WITHOUT CONTRAST TECHNIQUE: Contiguous axial images were obtained from the base of the skull through the vertex without intravenous contrast. COMPARISON:  None. FINDINGS: There is no intracranial hemorrhage, mass or evidence of acute infarction. There is no extra-axial fluid collection. Gray matter and white matter appear normal. Cerebral volume is normal for age. Brainstem and posterior fossa are unremarkable. The CSF spaces appear normal. The bony structures are intact. The visible portions of the paranasal sinuses are clear. IMPRESSION: Normal brain Electronically Signed   By: Ellery Plunk M.D.   On: 02/02/2016 21:04   I have personally reviewed and evaluated these images and lab results as part of my medical decision-making.   EKG Interpretation None      MDM   Final diagnoses:  Headache    25 year old male presents with several complaints. Reports migraine. Per record review, patient was evaluated in the ED for the same thing yesterday and subsequently discharged after symptom improvement. Today, he also reports right ear pain, dizziness, nausea, and vomiting. Denies fever, chills, abdominal pain, diarrhea, constipation, urinary symptoms. Denies history of headaches.  Patient is afebrile. Vital signs stable. Normal neuro exam with no focal deficit. TMs clear bilaterally. Heart regular rate and rhythm. Lungs clear to auscultation bilaterally. Abdomen soft, nontender, nondistended. Patient moves all extremities without difficulty.  Will obtain basic labs, head CT. CBC negative for leukocytosis or anemia. BMP  unremarkable. Head CT negative. Patient given fluids, reglan, benadryl, and toradol for symptoms.  On reassessment of patient, he reports significant symptom improvement. Patient is non-toxic and well-appearing, feel he is stable for discharge at this time. Patient to follow-up with PCP. Strict return precautions discussed. Patient verbalizes his understanding and is in agreement with plan.  BP 134/71 mmHg  Pulse 65  Temp(Src) 98.4 F (36.9 C) (Oral)  Resp 20  SpO2 99%       Mady Gemma, PA-C 02/03/16 0030  Geoffery Lyons, MD 02/05/16 1654

## 2016-02-02 NOTE — Discharge Instructions (Signed)
1. Medications: ibuprofen, usual home medications 2. Treatment: rest, drink plenty of fluids 3. Follow Up: please followup with the [facility] (go to walk in clinic on Thursday or Friday) for discussion of your diagnoses and further evaluation after today's visit; if you do not have a primary care doctor use the phone number listed in your discharge paperwork to find one; please return to the ER for severe headache, numbness, weakness, new or worsening symptoms   Migraine Headache A migraine headache is very bad, throbbing pain on one or both sides of your head. Talk to your doctor about what things may bring on (trigger) your migraine headaches. HOME CARE  Only take medicines as told by your doctor.  Lie down in a dark, quiet room when you have a migraine.  Keep a journal to find out if certain things bring on migraine headaches. For example, write down:  What you eat and drink.  How much sleep you get.  Any change to your diet or medicines.  Lessen how much alcohol you drink.  Quit smoking if you smoke.  Get enough sleep.  Lessen any stress in your life.  Keep lights dim if bright lights bother you or make your migraines worse. GET HELP RIGHT AWAY IF:   Your migraine becomes really bad.  You have a fever.  You have a stiff neck.  You have trouble seeing.  Your muscles are weak, or you lose muscle control.  You lose your balance or have trouble walking.  You feel like you will pass out (faint), or you pass out.  You have really bad symptoms that are different than your first symptoms. MAKE SURE YOU:   Understand these instructions.  Will watch your condition.  Will get help right away if you are not doing well or get worse.   This information is not intended to replace advice given to you by your health care provider. Make sure you discuss any questions you have with your health care provider.   Document Released: 07/23/2008 Document Revised:  01/06/2012 Document Reviewed: 06/21/2013 Elsevier Interactive Patient Education [employer]2016 [employer] Inc.

## 2016-02-02 NOTE — ED Notes (Signed)
Patient states he has been struggling with migraine headaches, right ear pain, dizziness, and nausea since Sunday. Patient was seen by Dr Patria Maneampos yesterday evening and received rx for Vicodin and ibuprofen. Patient did not get his medications filled, states he had the ibuprofen at home already. Patient states he fell today cutting his left middle finger. Patient mother states the patient fell "good", patient states he also hurt his shoulder. Patient mother is upset that blood work was not done yesterday. Last dose of ibuprofen was at 1400 today.

## 2017-04-29 IMAGING — CT CT HEAD W/O CM
2 series · 17 of 30 positions shown, 20 images · non-contrast
Comparison: None.

CLINICAL DATA: Frontal headache for 7 days

EXAM:
CT HEAD WITHOUT CONTRAST
TECHNIQUE: Contiguous axial images were obtained from the base of the skull
through the vertex without intravenous contrast.

[Series 2: head w/o · axial · non-contrast · 0.41mm/px · z∈[+1464,+1584]mm · 9 of 32 slices shown, 12 images]
[im 4/32  brain]
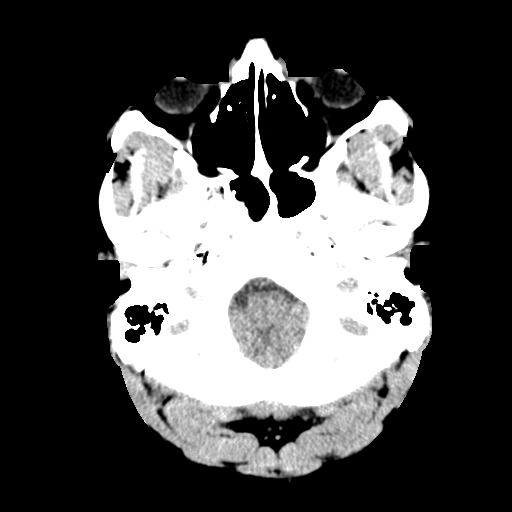
[im 4/32  bone]
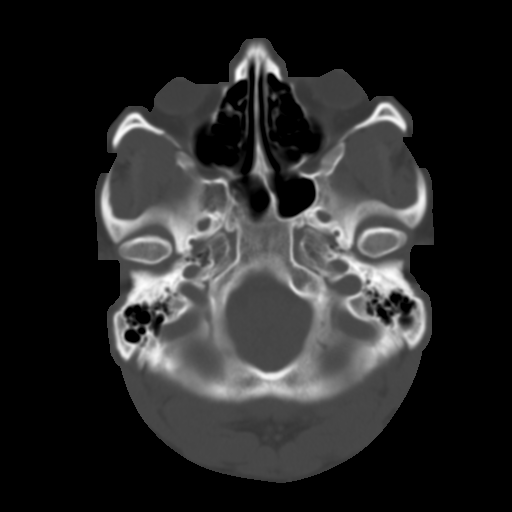
[im 7/32  brain]
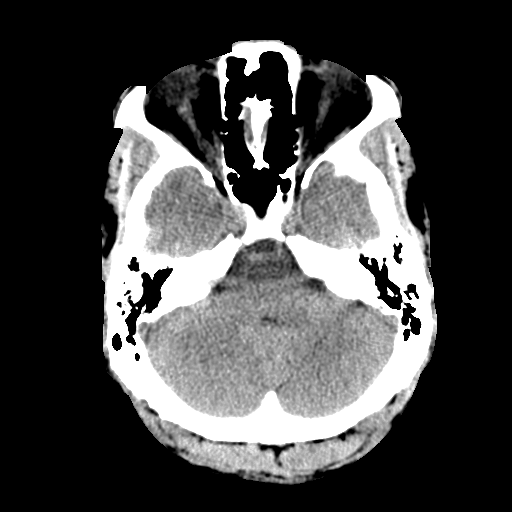
[im 10/32  brain]
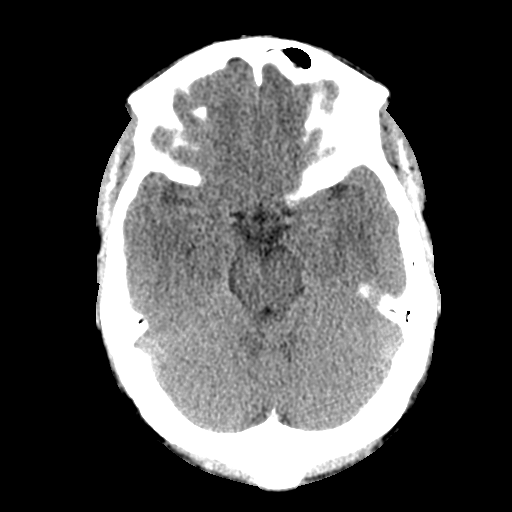
[im 13/32  brain]
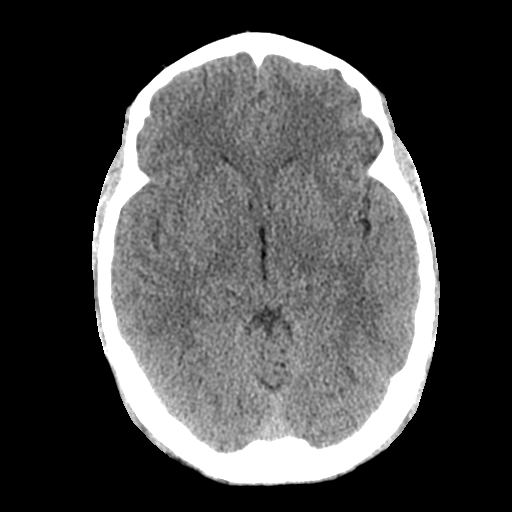
[im 16/32  brain]
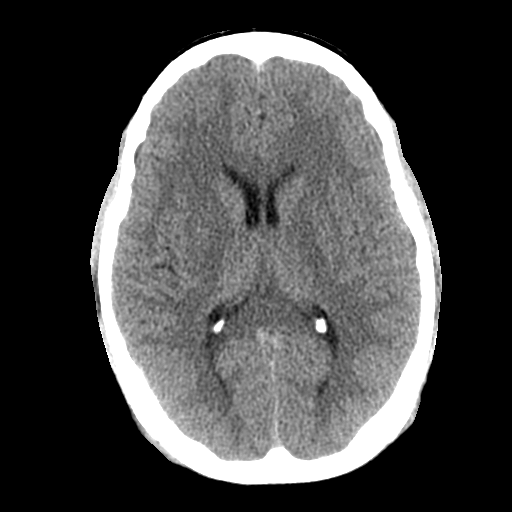
[im 16/32  bone]
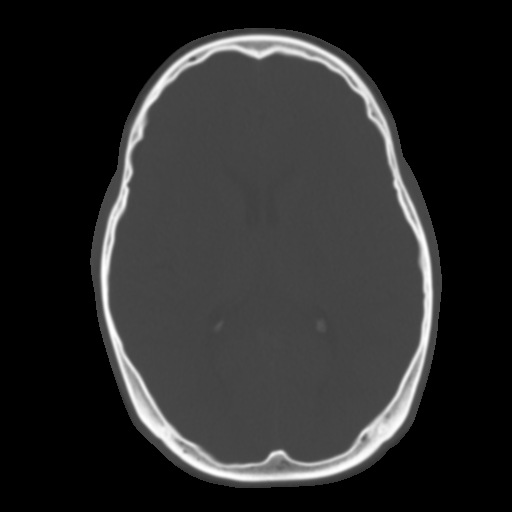
[im 19/32  brain]
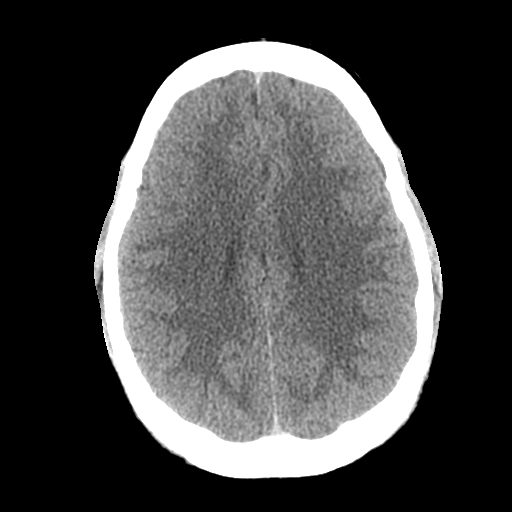
[im 22/32  brain]
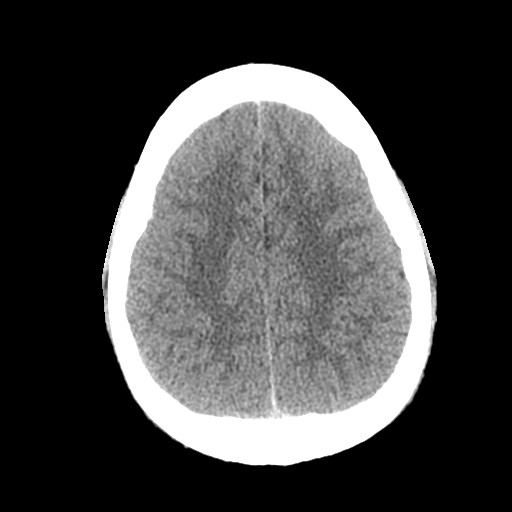
[im 25/32  brain]
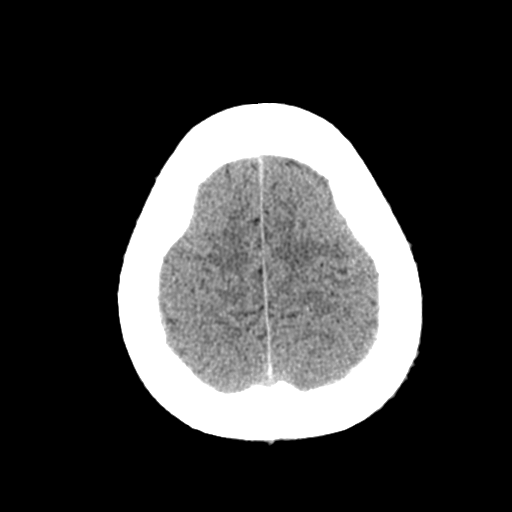
[im 28/32  brain]
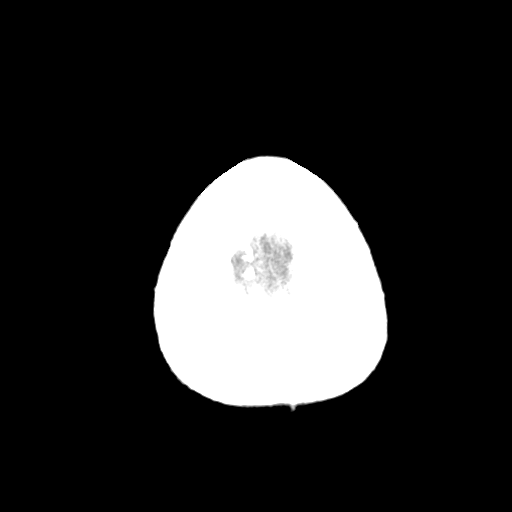
[im 28/32  bone]
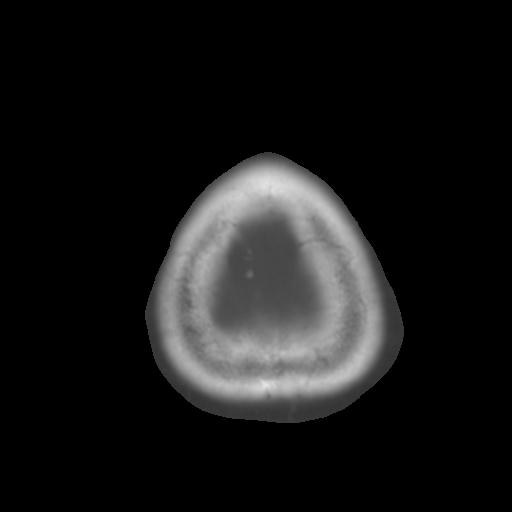

[Series 3: bone windows · axial · 0.41mm/px · z∈[+1464,+1587]mm · 8 of 53 slices shown]
[im 6/53  bone]
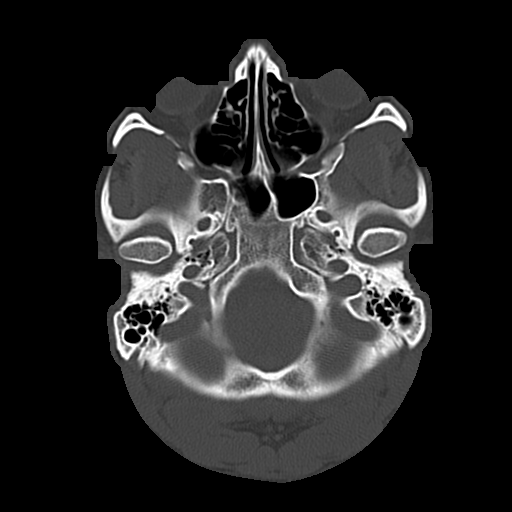
[im 12/53  bone]
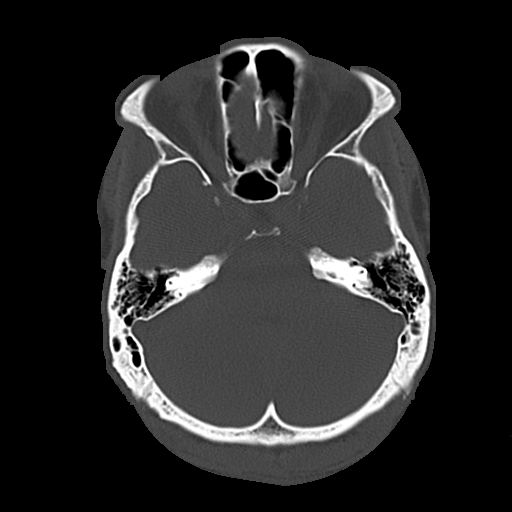
[im 18/53  bone]
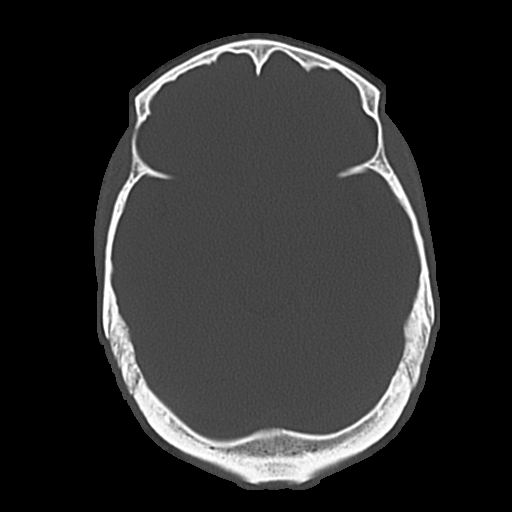
[im 24/53  bone]
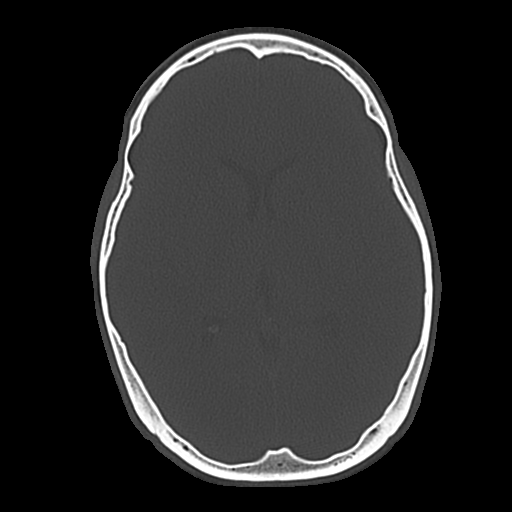
[im 29/53  bone]
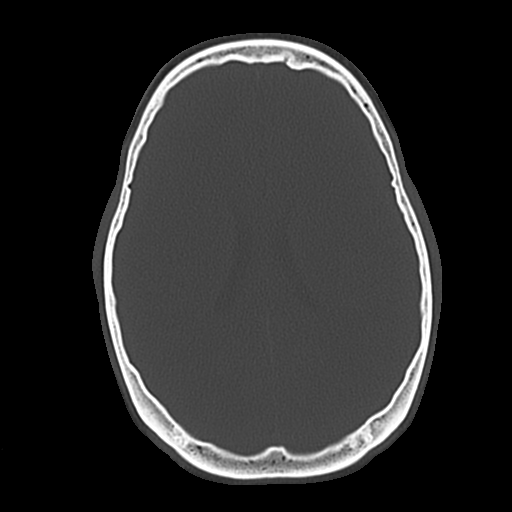
[im 35/53  bone]
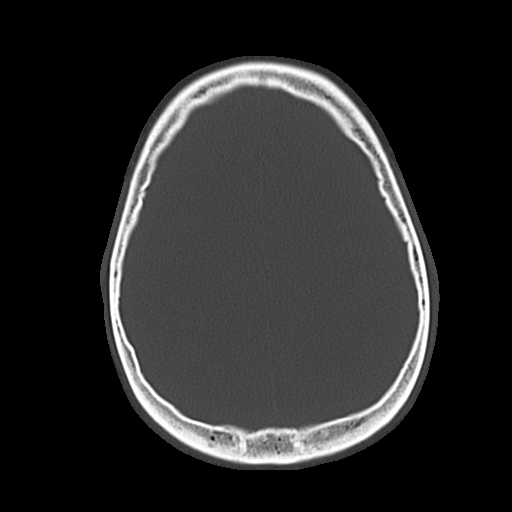
[im 41/53  bone]
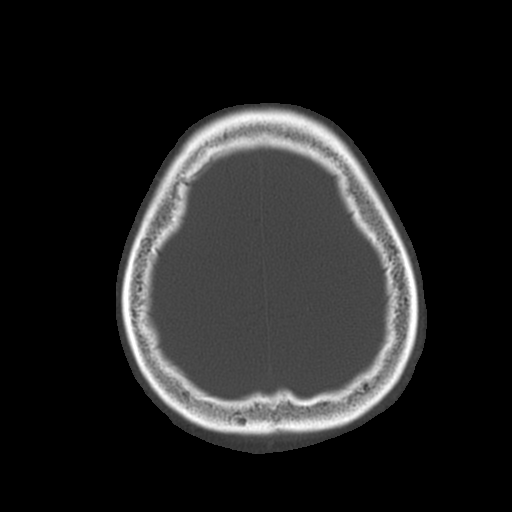
[im 47/53  bone]
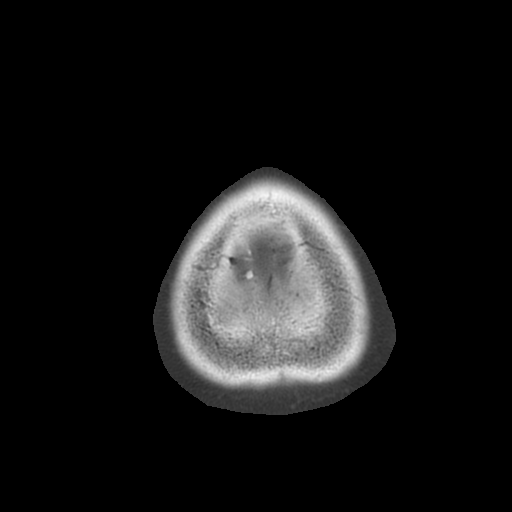

[17 of 30 positions shown; findings below may reference images not displayed]

FINDINGS: There is no intracranial hemorrhage, mass or evidence of acute
infarction. There is no extra-axial fluid collection. Gray matter
and white matter appear normal. Cerebral volume is normal for age.
Brainstem and posterior fossa are unremarkable. The CSF spaces
appear normal.

The bony structures are intact. The visible portions of the
paranasal sinuses are clear.
IMPRESSION: Normal brain

## 2018-07-07 ENCOUNTER — Encounter (HOSPITAL_BASED_OUTPATIENT_CLINIC_OR_DEPARTMENT_OTHER): Payer: Self-pay

## 2018-07-07 ENCOUNTER — Emergency Department (HOSPITAL_BASED_OUTPATIENT_CLINIC_OR_DEPARTMENT_OTHER)
Admission: EM | Admit: 2018-07-07 | Discharge: 2018-07-07 | Disposition: A | Discharge status: Home / Self Care | Payer: Self-pay | Attending: Emergency Medicine | Admitting: Emergency Medicine

## 2018-07-07 ENCOUNTER — Emergency Department (HOSPITAL_BASED_OUTPATIENT_CLINIC_OR_DEPARTMENT_OTHER): Payer: Self-pay

## 2018-07-07 DIAGNOSIS — M940 Chondrocostal junction syndrome [Tietze]: Secondary | ICD-10-CM | POA: Insufficient documentation

## 2018-07-07 DIAGNOSIS — F17210 Nicotine dependence, cigarettes, uncomplicated: Secondary | ICD-10-CM | POA: Insufficient documentation

## 2018-07-07 DIAGNOSIS — R079 Chest pain, unspecified: Secondary | ICD-10-CM | POA: Insufficient documentation

## 2018-07-07 HISTORY — DX: Other enthesopathies, not elsewhere classified: M77.8

## 2018-07-07 HISTORY — DX: Migraine, unspecified, not intractable, without status migrainosus: G43.909

## 2018-07-07 LAB — COMPREHENSIVE METABOLIC PANEL
ALK PHOS: 68 U/L (ref 38–126)
ALT: 14 U/L (ref 0–44)
ANION GAP: 10 (ref 5–15)
AST: 30 U/L (ref 15–41)
Albumin: 4 g/dL (ref 3.5–5.0)
BUN: 18 mg/dL (ref 6–20)
CALCIUM: 8.5 mg/dL — AB (ref 8.9–10.3)
CO2: 23 mmol/L (ref 22–32)
CREATININE: 1 mg/dL (ref 0.61–1.24)
Chloride: 107 mmol/L (ref 98–111)
Glucose, Bld: 133 mg/dL — ABNORMAL HIGH (ref 70–99)
Potassium: 3.9 mmol/L (ref 3.5–5.1)
SODIUM: 140 mmol/L (ref 135–145)
Total Bilirubin: 0.5 mg/dL (ref 0.3–1.2)
Total Protein: 6.6 g/dL (ref 6.5–8.1)

## 2018-07-07 LAB — CBC WITH DIFFERENTIAL/PLATELET
Basophils Absolute: 0 10*3/uL (ref 0.0–0.1)
Basophils Relative: 0 %
EOS ABS: 0.1 10*3/uL (ref 0.0–0.7)
Eosinophils Relative: 2 %
HCT: 42.9 % (ref 39.0–52.0)
HEMOGLOBIN: 14.6 g/dL (ref 13.0–17.0)
LYMPHS PCT: 29 %
Lymphs Abs: 1.5 10*3/uL (ref 0.7–4.0)
MCH: 29.7 pg (ref 26.0–34.0)
MCHC: 34 g/dL (ref 30.0–36.0)
MCV: 87.2 fL (ref 78.0–100.0)
Monocytes Absolute: 0.4 10*3/uL (ref 0.1–1.0)
Monocytes Relative: 7 %
NEUTROS ABS: 3.3 10*3/uL (ref 1.7–7.7)
NEUTROS PCT: 62 %
PLATELETS: 181 10*3/uL (ref 150–400)
RBC: 4.92 MIL/uL (ref 4.22–5.81)
RDW: 12.9 % (ref 11.5–15.5)
WBC: 5.3 10*3/uL (ref 4.0–10.5)

## 2018-07-07 LAB — TROPONIN I

## 2018-07-07 MED ORDER — IBUPROFEN 800 MG PO TABS: 800.0000 mg | ORAL_TABLET | Freq: Three times a day (TID) | ORAL | 0 refills | Status: AC

## 2018-07-07 MED ORDER — KETOROLAC TROMETHAMINE 30 MG/ML IJ SOLN
30.0000 mg | Freq: Once | INTRAMUSCULAR | Status: AC
  Administered 2018-07-07: 30 mg via INTRAVENOUS
  Filled 2018-07-07: qty 1

## 2018-07-07 MED FILL — IBUPROFEN 800 MG TAB: 800 | 7 days supply | Qty: 21 | Fill #0

## 2018-07-07 NOTE — ED Triage Notes (Signed)
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

## 2018-07-07 NOTE — ED Notes (Signed)
Pt on monitor 

## 2018-07-07 NOTE — ED Provider Notes (Signed)
Emergency Department Provider Note   I have reviewed the triage vital signs and the nursing notes.   HISTORY  Chief Complaint Chest Pain   HPI Allen Mckinney is a 27 y.o. male [place] the emergency department for chest pain.  Patient does not really have any significant past medical history and states that he started having retrosternal chest pressure yesterday evening while at rest.  This progressively worsened and became a chest pain and now he has just chest pain.  States that it is sharp in nature and points right has mid sternum as the location.  States is worse when he takes a deep breath or pushes on it.  He has had a cough recently but is nonproductive.  He is a smoker.  No fevers, trauma.  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  He does state that has been working more than normal recently. No other associated or modifying symptoms.    Past Medical History:  Diagnosis Date  . Elbow tendinitis   . Migraine     There are no active problems to display for this patient.   History reviewed. No pertinent surgical history.    Allergies Kiwi extract  No family history on file.  Social History Social History   Tobacco Use  . Smoking status: Current Every Day Smoker    Packs/day: 0.25    Types: Cigarettes  . Smokeless tobacco: Never Used  Substance Use Topics  . Alcohol use: No    Comment: occ  . Drug use: No    Review of Systems  All other systems negative except as documented in the HPI. All pertinent positives and negatives as reviewed in the HPI. ____________________________________________   PHYSICAL EXAM:  VITAL SIGNS: ED Triage Vitals  Enc Vitals Group     BP 07/07/18 0833 121/79     Pulse Rate 07/07/18 0833 64     Resp 07/07/18 0833 18     Temp 07/07/18 0833 98.5 F (36.9 C)     Temp Source 07/07/18 0833 Oral     SpO2 07/07/18 0833 100 %     Weight 07/07/18 0834 150 lb (68  kg)     Height 07/07/18 0834 5' 7.25" (1.708 m)     Head Circumference --      Peak Flow --      Pain Score 07/07/18 0833 8     Pain Loc --      Pain Edu? --      Excl. in GC? --     Constitutional: Alert and oriented. Well appearing and in no acute distress. Eyes: Conjunctivae are normal. PERRL. EOMI. Head: Atraumatic. Nose: No congestion/rhinnorhea. Mouth/Throat: Mucous membranes are moist.  Oropharynx non-erythematous. Neck: No stridor.  No meningeal signs.   Cardiovascular: Normal rate, regular rhythm. Good peripheral circulation. Grossly normal heart sounds.   Respiratory: Normal respiratory effort.  No retractions. Lungs CTAB. Gastrointestinal: Soft and nontender. No distention.  Musculoskeletal: No lower extremity tenderness nor edema. No gross deformities of extremities.  tenderness to palpation over sternum. Neurologic:  Normal speech and language. No gross focal neurologic deficits are appreciated.  Skin:  Skin is warm, dry and intact. No rash noted.   ____________________________________________   LABS (all labs ordered are listed, but only abnormal results are displayed)  Labs Reviewed  COMPREHENSIVE METABOLIC PANEL - Abnormal; Notable for the following components:      Result  Value   Glucose, Bld 133 (*)    Calcium 8.5 (*)    All other components within normal limits  CBC WITH DIFFERENTIAL/PLATELET  TROPONIN I   ____________________________________________  EKG   EKG Interpretation  Date/Time:  Tuesday July 07 2018 08:36:10 EDT Ventricular Rate:  62 PR Interval:    QRS Duration: 85 QT Interval:  405 QTC Calculation: 412 R Axis:   88 Text Interpretation:  Sinus arrhythmia ST elev, probable normal early repol pattern No significant change since last tracing in 08/2011 Confirmed by Marily Memos [phone]) on 07/07/2018 8:45:16 AM       ____________________________________________  RADIOLOGY  Dg Chest 2 View  Result Date: 07/07/2018 CLINICAL  DATA:  Chest pain, chest tightness over the last day, short of breath, smoking history EXAM: CHEST - 2 VIEW COMPARISON:  None. FINDINGS: No active infiltrate or effusion is seen. Mediastinal and hilar contours are unremarkable. The heart is within normal limits in size. No bony abnormality is seen. IMPRESSION: No active cardiopulmonary disease. Electronically Signed   By: Dwyane Dee M.D.   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  Low suspicion for pulmonary embolus and is PERC negative.  Low suspicion for ACS however did start out with pressure he does have a history of smoking and family history of heart disease we will check one troponin.  This pain is been consistent since last night at 8:00 so this is negative I do not think he needs a second 1.  His EKG looks okay without any evidence of infarction he does have a slightly benign early repolarization however.  This is similar to his previous ones in 2012.  Will treat discussed with rest with anti-inflammatories.  Work-up unremarkable.  Low suspicion for any emergent causes for his symptoms.  Will prescribe anti-inflammatories.     Pertinent labs & imaging results that were available during my care of the patient were reviewed by me and considered in my medical decision making (see chart for details).  ____________________________________________  FINAL CLINICAL IMPRESSION(S) / ED DIAGNOSES  Final diagnoses:  Nonspecific chest pain  Costochondritis     MEDICATIONS GIVEN DURING THIS VISIT:  Medications  ketorolac (TORADOL) 30 MG/ML injection 30 mg (30 mg Intravenous Given 07/07/18 0856)     NEW OUTPATIENT MEDICATIONS STARTED  DURING THIS VISIT:  Discharge Medication List as of 07/07/2018  9:57 AM    START taking these medications   Details  ibuprofen (ADVIL,MOTRIN) 800 MG tablet Take 1 tablet (800 mg total) by mouth 3 (three) times daily., Starting Tue 07/07/2018, Print        Note:  This note was prepared with assistance of Dragon voice recognition software. Occasional wrong-word or sound-a-like substitutions may have occurred due to the inherent limitations of voice recognition software.   Marily Memos, MD 07/07/18 1025

## 2019-10-02 IMAGING — CR DG CHEST 2V
2 series · 2 of 2 positions shown · non-contrast
Comparison: None.

CLINICAL DATA: Chest pain, chest tightness over the last day, short
of breath, smoking history

EXAM:
CHEST - 2 VIEW

[w chest pa]
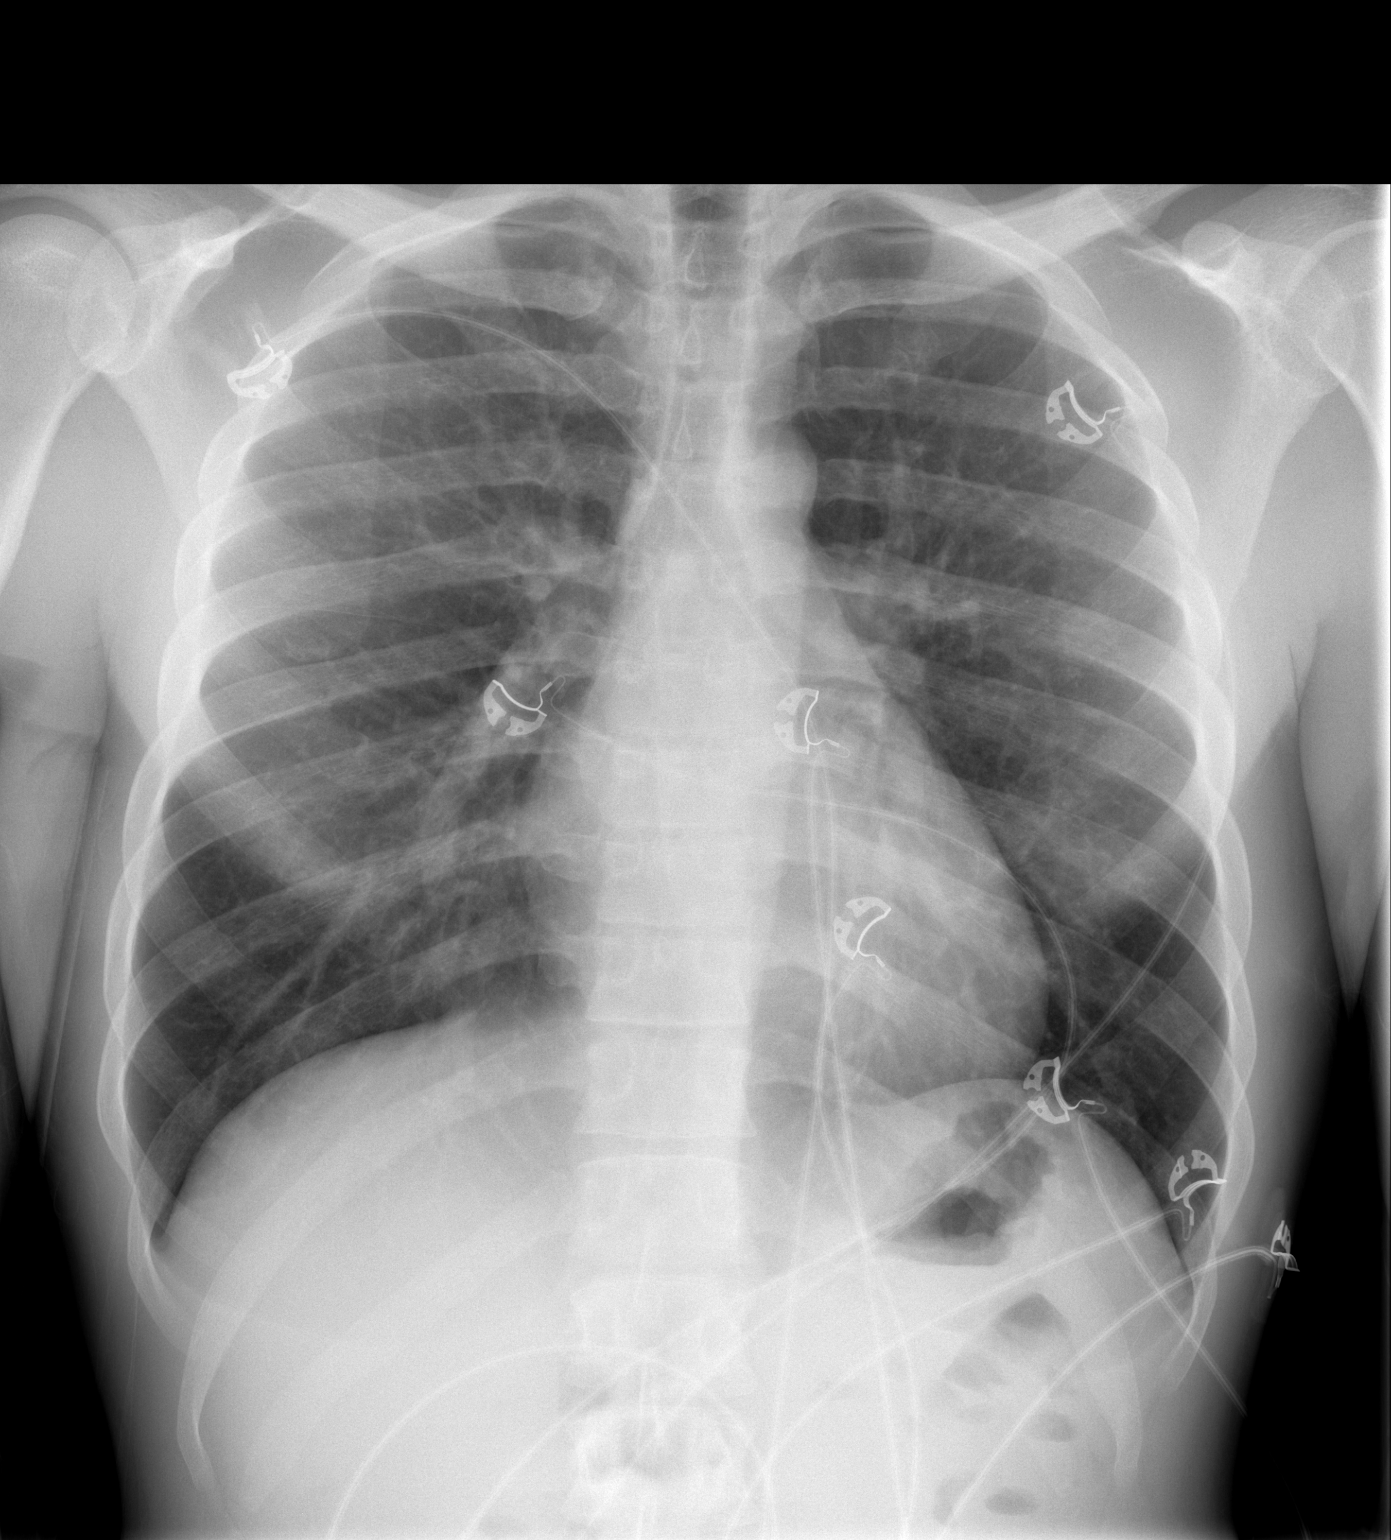

[w chest lat]
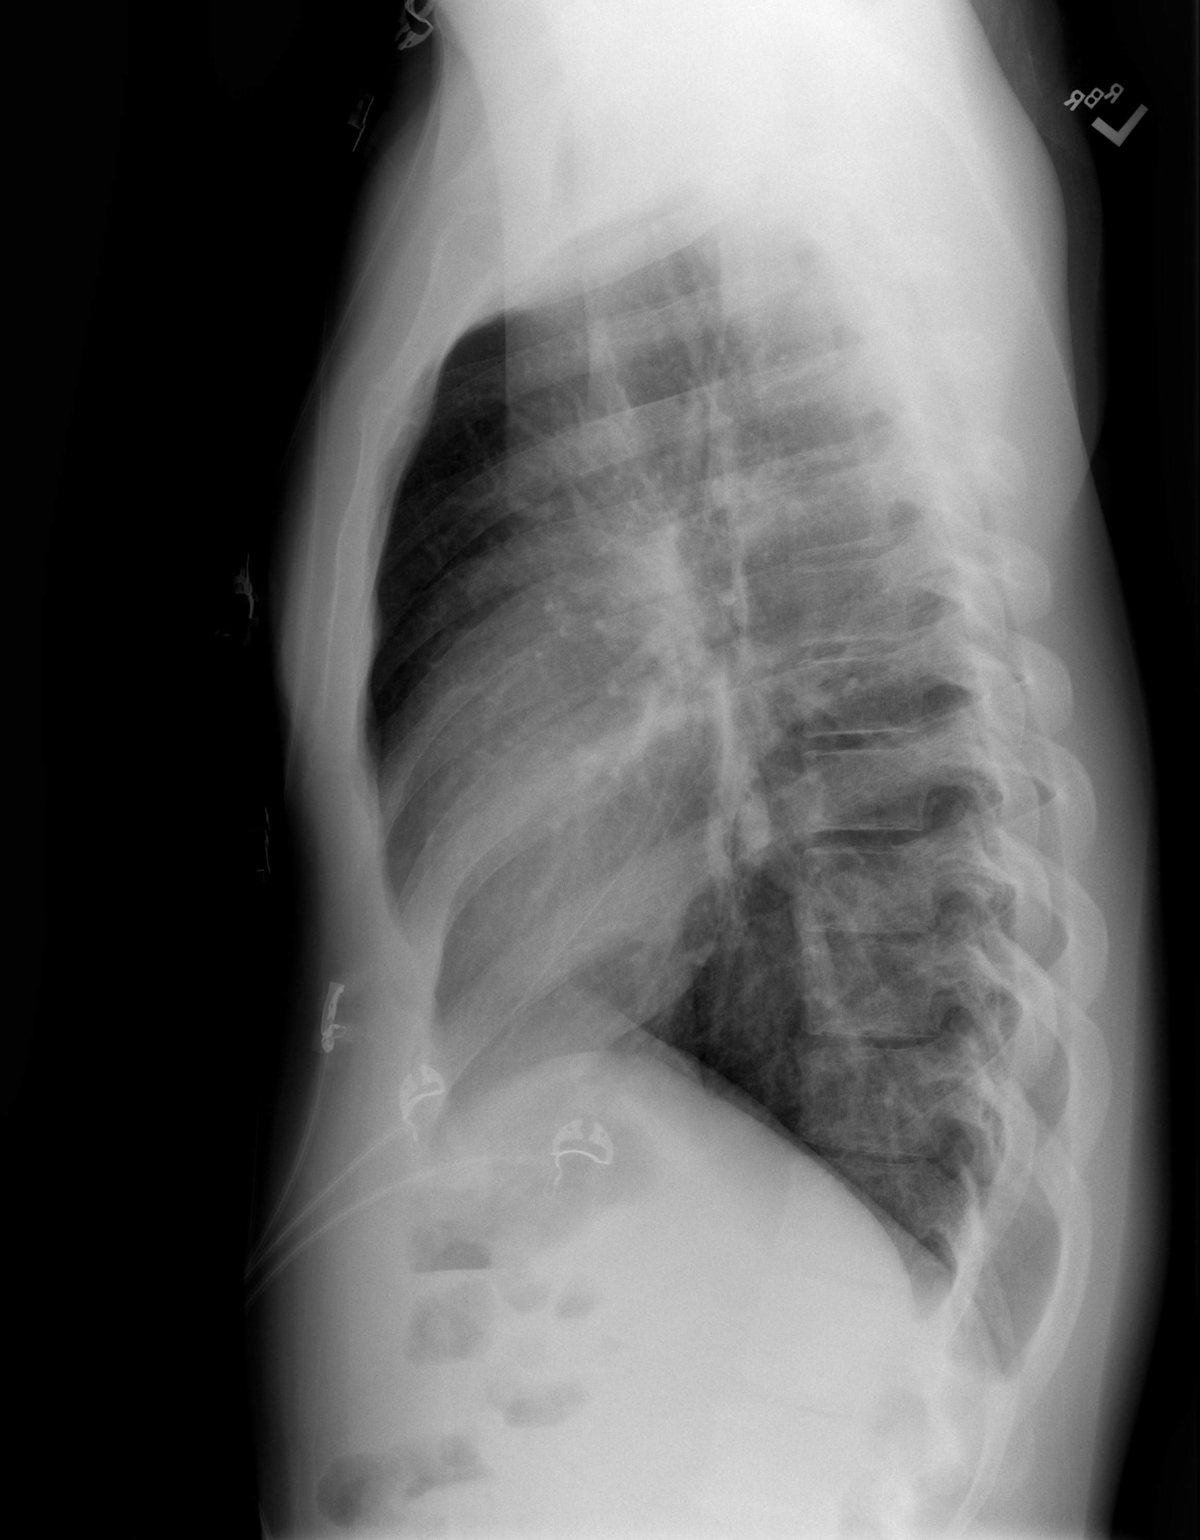

[2 of 2 positions shown; findings below may reference images not displayed]

FINDINGS: No active infiltrate or effusion is seen. Mediastinal and hilar
contours are unremarkable. The heart is within normal limits in
size. No bony abnormality is seen.
IMPRESSION: No active cardiopulmonary disease.
